# Patient Record
Sex: Female | Born: 2005 | Race: White | Hispanic: No | Marital: Single
Health system: Southern US, Community
[De-identification: ages and names within clinical notes are randomized; demographics above are authoritative.]

## PROBLEM LIST (undated history)

## (undated) ENCOUNTER — Emergency Department (HOSPITAL_COMMUNITY): Discharge status: Against Medical Advice | Payer: Medicaid Other

## (undated) DIAGNOSIS — J353 Hypertrophy of tonsils with hypertrophy of adenoids: Secondary | ICD-10-CM

---
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
## 2024-02-15 ENCOUNTER — Encounter: Payer: Self-pay | Admitting: Internal Medicine

## 2024-03-20 ENCOUNTER — Ambulatory Visit: Payer: Self-pay | Admitting: Physician Assistant

## 2024-05-15 ENCOUNTER — Ambulatory Visit: Admitting: Internal Medicine

## 2024-05-23 ENCOUNTER — Ambulatory Visit: Payer: Self-pay | Admitting: Physician Assistant
